# Patient Record
Sex: Female | Born: 1955 | Race: White | Hispanic: No | Marital: Married
Health system: Midwestern US, Community
[De-identification: ages and names within clinical notes are randomized; demographics above are authoritative.]

## PROBLEM LIST (undated history)

## (undated) DIAGNOSIS — I10 Essential (primary) hypertension: Secondary | ICD-10-CM

## (undated) DIAGNOSIS — E669 Obesity, unspecified: Secondary | ICD-10-CM

## (undated) DIAGNOSIS — M8008XA Age-related osteoporosis with current pathological fracture, vertebra(e), initial encounter for fracture: Secondary | ICD-10-CM

## (undated) DIAGNOSIS — I059 Rheumatic mitral valve disease, unspecified: Secondary | ICD-10-CM

## (undated) DIAGNOSIS — E785 Hyperlipidemia, unspecified: Secondary | ICD-10-CM

## (undated) HISTORY — DX: Obesity, unspecified: E66.9

## (undated) HISTORY — DX: Essential (primary) hypertension: I10

## (undated) HISTORY — PX: WRIST SURGERY: SHX841

## (undated) HISTORY — DX: Rheumatic mitral valve disease, unspecified: I05.9

## (undated) HISTORY — DX: Hyperlipidemia, unspecified: E78.5

## (undated) HISTORY — DX: Age-related osteoporosis with current pathological fracture, vertebra(e), initial encounter for fracture: M80.08XA

---

## 2004-05-22 ENCOUNTER — Encounter: Admission: RE | Admit: 2004-05-22 | Discharge: 2004-05-22 | Payer: Self-pay | Admitting: Family Medicine

## 2004-06-27 ENCOUNTER — Encounter (INDEPENDENT_AMBULATORY_CARE_PROVIDER_SITE_OTHER): Payer: Self-pay | Admitting: Specialist

## 2004-06-27 ENCOUNTER — Encounter: Admission: RE | Admit: 2004-06-27 | Discharge: 2004-06-27 | Payer: Self-pay | Admitting: Family Medicine

## 2004-07-05 ENCOUNTER — Ambulatory Visit (HOSPITAL_COMMUNITY): Admission: RE | Admit: 2004-07-05 | Discharge: 2004-07-05 | Payer: Self-pay | Admitting: Family Medicine

## 2004-08-29 ENCOUNTER — Ambulatory Visit: Payer: Self-pay | Admitting: Cardiology

## 2004-09-04 ENCOUNTER — Ambulatory Visit: Payer: Self-pay

## 2005-03-12 ENCOUNTER — Encounter: Admission: RE | Admit: 2005-03-12 | Discharge: 2005-03-12 | Payer: Self-pay | Admitting: Family Medicine

## 2005-03-18 ENCOUNTER — Encounter: Admission: RE | Admit: 2005-03-18 | Discharge: 2005-03-18 | Payer: Self-pay | Admitting: Family Medicine

## 2005-11-28 ENCOUNTER — Emergency Department (HOSPITAL_COMMUNITY): Admission: EM | Admit: 2005-11-28 | Discharge: 2005-11-28 | Payer: Self-pay | Admitting: Family Medicine

## 2006-05-05 ENCOUNTER — Encounter: Admission: RE | Admit: 2006-05-05 | Discharge: 2006-05-05 | Payer: Self-pay | Admitting: Family Medicine

## 2008-05-22 ENCOUNTER — Encounter: Admission: RE | Admit: 2008-05-22 | Discharge: 2008-05-22 | Payer: Self-pay | Admitting: Physician Assistant

## 2009-04-11 ENCOUNTER — Emergency Department (HOSPITAL_COMMUNITY): Admission: EM | Admit: 2009-04-11 | Discharge: 2009-04-11 | Payer: Self-pay | Admitting: Family Medicine

## 2009-07-02 ENCOUNTER — Encounter: Admission: RE | Admit: 2009-07-02 | Discharge: 2009-07-02 | Payer: Self-pay | Admitting: Family Medicine

## 2010-07-20 ENCOUNTER — Emergency Department (HOSPITAL_COMMUNITY): Admission: EM | Admit: 2010-07-20 | Discharge: 2010-07-21 | Payer: Self-pay | Admitting: Emergency Medicine

## 2010-09-15 ENCOUNTER — Encounter: Payer: Self-pay | Admitting: Family Medicine

## 2010-11-05 LAB — CBC
HCT: 37.1 % (ref 36.0–46.0)
Hemoglobin: 12.9 g/dL (ref 12.0–15.0)
MCH: 29.7 pg (ref 26.0–34.0)
MCHC: 34.8 g/dL (ref 30.0–36.0)
MCV: 85.5 fL (ref 78.0–100.0)
Platelets: 237 10*3/uL (ref 150–400)
RBC: 4.34 MIL/uL (ref 3.87–5.11)
RDW: 13.5 % (ref 11.5–15.5)
WBC: 7.4 10*3/uL (ref 4.0–10.5)

## 2010-11-05 LAB — DIFFERENTIAL
Basophils Absolute: 0 10*3/uL (ref 0.0–0.1)
Basophils Relative: 1 % (ref 0–1)
Eosinophils Absolute: 0.1 10*3/uL (ref 0.0–0.7)
Eosinophils Relative: 1 % (ref 0–5)
Lymphocytes Relative: 11 % — ABNORMAL LOW (ref 12–46)
Lymphs Abs: 0.8 10*3/uL (ref 0.7–4.0)
Monocytes Absolute: 0.5 10*3/uL (ref 0.1–1.0)
Monocytes Relative: 7 % (ref 3–12)
Neutro Abs: 6 10*3/uL (ref 1.7–7.7)
Neutrophils Relative %: 81 % — ABNORMAL HIGH (ref 43–77)

## 2010-11-05 LAB — COMPREHENSIVE METABOLIC PANEL
ALT: 27 U/L (ref 0–35)
AST: 29 U/L (ref 0–37)
Albumin: 3.7 g/dL (ref 3.5–5.2)
Alkaline Phosphatase: 52 U/L (ref 39–117)
BUN: 13 mg/dL (ref 6–23)
CO2: 27 mEq/L (ref 19–32)
Calcium: 8.7 mg/dL (ref 8.4–10.5)
Chloride: 101 mEq/L (ref 96–112)
Creatinine, Ser: 0.72 mg/dL (ref 0.4–1.2)
GFR calc Af Amer: >60 mL/min (ref >60)
GFR calc non Af Amer: >60 mL/min (ref >60)
Glucose, Bld: 130 mg/dL — ABNORMAL HIGH (ref 70–99)
Potassium: 3.5 mEq/L (ref 3.5–5.1)
Sodium: 136 mEq/L (ref 135–145)
Total Bilirubin: 0.5 mg/dL (ref 0.3–1.2)
Total Protein: 7.1 g/dL (ref 6.0–8.3)

## 2010-11-05 LAB — URINALYSIS, ROUTINE W REFLEX MICROSCOPIC
Bilirubin Urine: NEGATIVE
Glucose, UA: NEGATIVE mg/dL
Ketones, ur: NEGATIVE mg/dL
Leukocytes, UA: NEGATIVE
Nitrite: NEGATIVE
Protein, ur: NEGATIVE mg/dL
Specific Gravity, Urine: 1.021 (ref 1.005–1.030)
Urobilinogen, UA: 1 mg/dL (ref 0.0–1.0)
pH: 6 (ref 5.0–8.0)

## 2010-11-05 LAB — URINE MICROSCOPIC-ADD ON

## 2010-11-05 LAB — LIPASE, BLOOD: Lipase: 32 U/L (ref 11–59)

## 2010-11-30 LAB — POCT I-STAT, CHEM 8
BUN: 14 mg/dL (ref 6–23)
Calcium, Ion: 1.11 mmol/L — ABNORMAL LOW (ref 1.12–1.32)
Chloride: 103 mEq/L (ref 96–112)
Creatinine, Ser: 0.8 mg/dL (ref 0.4–1.2)
Glucose, Bld: 94 mg/dL (ref 70–99)
HCT: 39 % (ref 36.0–46.0)
Hemoglobin: 13.3 g/dL (ref 12.0–15.0)
Potassium: 3.2 mEq/L — ABNORMAL LOW (ref 3.5–5.1)
Sodium: 138 mEq/L (ref 135–145)
TCO2: 27 mmol/L (ref 0–100)

## 2011-08-28 ENCOUNTER — Other Ambulatory Visit: Payer: Self-pay | Admitting: Family Medicine

## 2011-08-28 DIAGNOSIS — Z1231 Encounter for screening mammogram for malignant neoplasm of breast: Secondary | ICD-10-CM

## 2011-09-04 ENCOUNTER — Ambulatory Visit
Admission: RE | Admit: 2011-09-04 | Discharge: 2011-09-04 | Disposition: A | Discharge status: Home / Self Care | Payer: 59 | Source: Ambulatory Visit | Attending: Family Medicine | Admitting: Family Medicine

## 2011-09-04 DIAGNOSIS — Z1231 Encounter for screening mammogram for malignant neoplasm of breast: Secondary | ICD-10-CM

## 2011-09-09 ENCOUNTER — Other Ambulatory Visit: Payer: Self-pay | Admitting: Family Medicine

## 2011-09-09 DIAGNOSIS — R928 Other abnormal and inconclusive findings on diagnostic imaging of breast: Secondary | ICD-10-CM

## 2011-09-17 ENCOUNTER — Ambulatory Visit
Admission: RE | Admit: 2011-09-17 | Discharge: 2011-09-17 | Disposition: A | Discharge status: Home / Self Care | Payer: 59 | Source: Ambulatory Visit | Attending: Family Medicine | Admitting: Family Medicine

## 2011-09-17 DIAGNOSIS — R928 Other abnormal and inconclusive findings on diagnostic imaging of breast: Secondary | ICD-10-CM

## 2011-09-25 LAB — HM MAMMOGRAPHY

## 2012-10-20 ENCOUNTER — Other Ambulatory Visit: Payer: Self-pay | Admitting: Internal Medicine

## 2012-10-20 DIAGNOSIS — IMO0002 Reserved for concepts with insufficient information to code with codable children: Secondary | ICD-10-CM

## 2012-10-20 DIAGNOSIS — M810 Age-related osteoporosis without current pathological fracture: Secondary | ICD-10-CM

## 2012-11-08 ENCOUNTER — Ambulatory Visit
Admission: RE | Admit: 2012-11-08 | Discharge: 2012-11-08 | Disposition: A | Discharge status: Home / Self Care | Payer: 59 | Source: Ambulatory Visit | Attending: Internal Medicine | Admitting: Internal Medicine

## 2012-11-08 DIAGNOSIS — IMO0002 Reserved for concepts with insufficient information to code with codable children: Secondary | ICD-10-CM

## 2012-11-08 DIAGNOSIS — M810 Age-related osteoporosis without current pathological fracture: Secondary | ICD-10-CM

## 2012-11-25 ENCOUNTER — Encounter: Payer: Self-pay | Admitting: Family Medicine

## 2012-11-25 DIAGNOSIS — I059 Rheumatic mitral valve disease, unspecified: Secondary | ICD-10-CM | POA: Insufficient documentation

## 2012-11-25 DIAGNOSIS — I10 Essential (primary) hypertension: Secondary | ICD-10-CM | POA: Insufficient documentation

## 2012-11-25 DIAGNOSIS — E669 Obesity, unspecified: Secondary | ICD-10-CM | POA: Insufficient documentation

## 2012-11-25 DIAGNOSIS — E785 Hyperlipidemia, unspecified: Secondary | ICD-10-CM | POA: Insufficient documentation

## 2012-11-26 ENCOUNTER — Encounter: Payer: Self-pay | Admitting: Family Medicine

## 2012-11-26 ENCOUNTER — Ambulatory Visit (INDEPENDENT_AMBULATORY_CARE_PROVIDER_SITE_OTHER): Payer: 59 | Admitting: Family Medicine

## 2012-11-26 VITALS — BP 126/74 | HR 60 | Temp 98.1°F | Resp 16

## 2012-11-26 DIAGNOSIS — I10 Essential (primary) hypertension: Secondary | ICD-10-CM

## 2012-11-26 DIAGNOSIS — M8008XD Age-related osteoporosis with current pathological fracture, vertebra(e), subsequent encounter for fracture with routine healing: Secondary | ICD-10-CM

## 2012-11-26 DIAGNOSIS — M8448XD Pathological fracture, other site, subsequent encounter for fracture with routine healing: Secondary | ICD-10-CM

## 2012-11-26 DIAGNOSIS — M8008XA Age-related osteoporosis with current pathological fracture, vertebra(e), initial encounter for fracture: Secondary | ICD-10-CM | POA: Insufficient documentation

## 2012-11-26 LAB — LIPID PANEL
Cholesterol: 145 mg/dL (ref 0–200)
LDL Cholesterol: 85 mg/dL (ref 0–99)
Triglycerides: 120 mg/dL (ref <150)

## 2012-11-26 LAB — BASIC METABOLIC PANEL
BUN: 13 mg/dL (ref 6–23)
Chloride: 106 mEq/L (ref 96–112)
Creat: 0.7 mg/dL (ref 0.50–1.10)

## 2012-11-26 LAB — HEPATIC FUNCTION PANEL
Alkaline Phosphatase: 58 U/L (ref 39–117)
Indirect Bilirubin: 0.3 mg/dL (ref 0.0–0.9)
Total Protein: 6.5 g/dL (ref 6.0–8.3)

## 2012-11-26 MED ORDER — BENAZEPRIL-HYDROCHLOROTHIAZIDE 20-12.5 MG PO TABS: 1.0000 | ORAL_TABLET | Freq: Every day | ORAL | Status: DC

## 2012-11-26 NOTE — Progress Notes (Signed)
Subjective:     Patient ID: Renea Ee, female   DOB: [birth year], 57 y.o.   MRN: [id number]  HPI  Patient is here for followup of her hypertension. Currently she is on Norvasc 5 mg by mouth daily and benazepril 20 mg by mouth daily.  She denies chest pain shortness of breath or dyspnea on exertion. She does complain of swelling in her legs. She also complains increased water retention to her body ever since we stopped the diuretic.  She's also requesting recheck her fasting lipid panel today.  Of note since last time I saw her she fell while trying to lift a couch. She sat down hard on the floor. She sustained a L1 vertebral fracture. She is currently being managed by Dr. Shon Baton. She was found to have a vitamin D level that was low at 26. I recommended she start vitamin D supplementation. Dr. 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 She requested my opinion on that.  Her bone density test revealed a T score of -1.6 the left femur but otherwise relatively normal. Past Medical History  Diagnosis Date  . Hypertension   . Obesity   . Mitral valve disorder   . Hyperlipidemia   . Vertebral fracture, osteoporotic    Med list is reviewed.  She has no known drug allergies  Review of Systems    review of systems is otherwise negative Objective:   Physical Exam  Constitutional: She appears well-developed and well-nourished.  HENT:  Mouth/Throat: Oropharynx is clear and moist.  Eyes: Conjunctivae are normal. Pupils are equal, round, and reactive to light.  Neck: Normal range of motion. Neck supple. No thyromegaly present.  Cardiovascular: Normal rate, regular rhythm, normal heart sounds and intact distal pulses.  Exam reveals no gallop and no friction rub.   No murmur heard. Pulmonary/Chest: Effort normal and breath sounds normal. No respiratory distress. She has no wheezes. She has no rales.  Abdominal: Soft. Bowel sounds are normal.   she has  trace bipedal edema with numerous varicosities in both legs     Assessment:     Hypertension Cholesterol screen Vertebral fracture Low vitamin D    Plan:     1. Essential hypertension, benign We will screen the patient's cholesterol today. Her goal LDL is less than 130.  Discontinue Norvasc and benazepril. Begin benazepril/HCTZ 20/12.5 one by mouth daily. 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 Therefore I recommended she start the Boniva recommended by Dr. Shon Baton. If Sandrea Hammond is too expensive I did discuss with her possibly using Fosamax because it is generic.

## 2012-11-29 ENCOUNTER — Telehealth: Payer: Self-pay | Admitting: Family Medicine

## 2012-11-29 NOTE — Telephone Encounter (Signed)
Yes, lets see if hctz with just 1 benazepril will control her bp and help with leg swelling.

## 2012-11-29 NOTE — Telephone Encounter (Signed)
Pt states that you changed her BP med to Benazepril and HCTZ to 20/12.5 1 po qd and took her off the Amlodipine. However she was taking Benazepril 20mg  2 (two) QD. Do you want her to just take one?

## 2012-11-29 NOTE — Telephone Encounter (Signed)
Pt aware.

## 2013-05-05 ENCOUNTER — Encounter: Payer: Self-pay | Admitting: Family Medicine

## 2013-05-05 ENCOUNTER — Ambulatory Visit (INDEPENDENT_AMBULATORY_CARE_PROVIDER_SITE_OTHER): Payer: 59 | Admitting: Family Medicine

## 2013-05-05 VITALS — BP 132/74 | HR 64 | Temp 98.1°F | Resp 18 | Wt 212.0 lb

## 2013-05-05 DIAGNOSIS — B372 Candidiasis of skin and nail: Secondary | ICD-10-CM

## 2013-05-05 MED ORDER — CLOTRIMAZOLE-BETAMETHASONE 1-0.05 % EX CREA: TOPICAL_CREAM | Freq: Two times a day (BID) | CUTANEOUS | Status: DC

## 2013-05-05 MED ORDER — FLUCONAZOLE 150 MG PO TABS: ORAL_TABLET | ORAL | Status: DC

## 2013-05-05 NOTE — Progress Notes (Signed)
  Subjective:    Patient ID: Gabrielle Padilla, female    DOB: 1956/06/12, 57 y.o.   MRN: [id number]  HPI Patient reports a two-week history of a painful erythematous rash underneath her stomach panus and in the folds of her growing between her thighs and her perineum.  The rash is bright red with satellite erythematous papules surrounding it. It has the characteristic appearance of candida intertrigo. Past Medical History  Diagnosis Date  . Hypertension   . Obesity   . Mitral valve disorder   . Hyperlipidemia   . Vertebral fracture, osteoporotic    Current Outpatient Prescriptions on File Prior to Visit  Medication Sig Dispense Refill  . benazepril-hydrochlorthiazide (LOTENSIN HCT) 20-12.5 MG per tablet Take 1 tablet by mouth daily.  90 tablet  3  . fish oil-omega-3 fatty acids 1000 MG capsule Take 1,000 mg by mouth daily.       No current facility-administered medications on file prior to visit.   No Known Allergies History   Social History  . Marital Status: Married    Spouse Name: N/A    Number of Children: N/A  . Years of Education: N/A   Occupational History  . Not on file.   Social History Main Topics  . Smoking status: Never Smoker   . Smokeless tobacco: Not on file  . Alcohol Use: No  . Drug Use: No  . Sexual Activity: Not on file   Other Topics Concern  . Not on file   Social History Narrative  . No narrative on file      Review of Systems  All other systems reviewed and are negative.       Objective:   Physical Exam  Vitals reviewed. Cardiovascular: Normal rate and regular rhythm.   Pulmonary/Chest: Effort normal and breath sounds normal.  Skin: Skin is warm. Rash noted. There is erythema.  Severe Candida intertrigo underneath her stomach pannus and in the skin folds between her thigh and perineum.        Assessment & Plan:  1. Candidal intertrigo The rash is severe. Begin Diflucan 150 mg by mouth QOD for 2 weeks and apply Lotrisone twice a  day for 14 days.  Recheck in 2 weeks if no better. - fluconazole (DIFLUCAN) 150 MG tablet; Take 1 pill every other day for 2 weeks.  Dispense: 7 tablet; Refill: 0 - clotrimazole-betamethasone (LOTRISONE) cream; Apply topically 2 (two) times daily. Apply twice a day for 14 days  Dispense: 45 g; Refill: 0

## 2013-08-30 ENCOUNTER — Other Ambulatory Visit: Payer: Self-pay

## 2013-08-30 DIAGNOSIS — Z1231 Encounter for screening mammogram for malignant neoplasm of breast: Secondary | ICD-10-CM

## 2013-09-14 ENCOUNTER — Ambulatory Visit
Admission: RE | Admit: 2013-09-14 | Discharge: 2013-09-14 | Disposition: A | Discharge status: Home / Self Care | Payer: 59 | Source: Ambulatory Visit

## 2013-09-14 DIAGNOSIS — Z1231 Encounter for screening mammogram for malignant neoplasm of breast: Secondary | ICD-10-CM

## 2013-11-04 ENCOUNTER — Other Ambulatory Visit: Payer: Self-pay | Admitting: Family Medicine

## 2013-11-04 NOTE — Telephone Encounter (Signed)
Medication filled x1 with no refills.   Requires office visit before any further refills can be given.  

## 2014-03-17 ENCOUNTER — Encounter: Payer: Self-pay | Admitting: Family Medicine

## 2014-03-17 ENCOUNTER — Ambulatory Visit (INDEPENDENT_AMBULATORY_CARE_PROVIDER_SITE_OTHER): Payer: 59 | Admitting: Family Medicine

## 2014-03-17 VITALS — BP 128/70 | HR 68 | Temp 97.9°F | Resp 14 | Ht 64.0 in | Wt 189.0 lb

## 2014-03-17 DIAGNOSIS — I10 Essential (primary) hypertension: Secondary | ICD-10-CM

## 2014-03-17 LAB — COMPLETE METABOLIC PANEL WITH GFR
ALBUMIN: 4 g/dL (ref 3.5–5.2)
ALT: 17 U/L (ref 0–35)
AST: 16 U/L (ref 0–37)
Alkaline Phosphatase: 45 U/L (ref 39–117)
BUN: 12 mg/dL (ref 6–23)
CO2: 27 meq/L (ref 19–32)
Calcium: 8.9 mg/dL (ref 8.4–10.5)
Chloride: 104 mEq/L (ref 96–112)
Creat: 0.7 mg/dL (ref 0.50–1.10)
GLUCOSE: 86 mg/dL (ref 70–99)
Potassium: 3.6 mEq/L (ref 3.5–5.3)
SODIUM: 141 meq/L (ref 135–145)
TOTAL PROTEIN: 6.4 g/dL (ref 6.0–8.3)
Total Bilirubin: 0.6 mg/dL (ref 0.2–1.2)

## 2014-03-17 LAB — LIPID PANEL
CHOLESTEROL: 119 mg/dL (ref 0–200)
HDL: 32 mg/dL — ABNORMAL LOW (ref >39)
LDL Cholesterol: 68 mg/dL (ref 0–99)
Total CHOL/HDL Ratio: 3.7 Ratio
Triglycerides: 95 mg/dL (ref <150)
VLDL: 19 mg/dL (ref 0–40)

## 2014-03-17 MED ORDER — BENAZEPRIL-HYDROCHLOROTHIAZIDE 20-12.5 MG PO TABS: ORAL_TABLET | ORAL | Status: DC

## 2014-03-17 NOTE — Progress Notes (Signed)
   Subjective:    Patient ID: Gabrielle Padilla, female    DOB: 1956-03-26, 58 y.o.   MRN: [id number]  HPI Patient is here for followup of her hypertension. Her blood pressures currently well controlled at 120/70. She continues to take benazepril hydrochlorothiazide 20/12.5 mg one by mouth daily. She denies any chest pain shortness of breath or dyspnea on exertion. She is overdue for pap smear. Past Medical History  Diagnosis Date  . Hypertension   . Obesity   . Mitral valve disorder   . Hyperlipidemia   . Vertebral fracture, osteoporotic    Past Surgical History  Procedure Laterality Date  . Cesarean section      x 3  . Wrist surgery     Current outpatient prescriptions:benazepril-hydrochlorthiazide (LOTENSIN HCT) 20-12.5 MG per tablet, TAKE 1 TABLET BY MOUTH DAILY., Disp: 90 tablet, Rfl: 3 No Known Allergies History   Social History  . Marital Status: Married    Spouse Name: N/A    Number of Children: N/A  . Years of Education: N/A   Occupational History  . Not on file.   Social History Main Topics  . Smoking status: Never Smoker   . Smokeless tobacco: Not on file  . Alcohol Use: No  . Drug Use: No  . Sexual Activity: Not on file   Other Topics Concern  . Not on file   Social History Narrative  . No narrative on file      Review of Systems  All other systems reviewed and are negative.      Objective:   Physical Exam  Vitals reviewed. Cardiovascular: Normal rate, regular rhythm and normal heart sounds.   No murmur heard. Pulmonary/Chest: Effort normal and breath sounds normal. No respiratory distress. She has no wheezes. She has no rales.  Abdominal: Soft. Bowel sounds are normal. She exhibits no distension. There is no tenderness. There is no rebound and no guarding.  Musculoskeletal: She exhibits no edema.          Assessment & Plan:  1. Essential hypertension Blood pressure is well controlled. I will make no changes in her medication at this time.  I will check a fasting lipid panel. Also recommend she schedule complete physical exam is limited due to Pap smear at her earliest convenience - COMPLETE METABOLIC PANEL WITH GFR - Lipid panel

## 2015-02-16 ENCOUNTER — Other Ambulatory Visit: Payer: Self-pay

## 2015-02-16 DIAGNOSIS — Z1231 Encounter for screening mammogram for malignant neoplasm of breast: Secondary | ICD-10-CM

## 2015-03-05 ENCOUNTER — Ambulatory Visit
Admission: RE | Admit: 2015-03-05 | Discharge: 2015-03-05 | Disposition: A | Discharge status: Home / Self Care | Payer: 59 | Source: Ambulatory Visit

## 2015-03-05 DIAGNOSIS — Z1231 Encounter for screening mammogram for malignant neoplasm of breast: Secondary | ICD-10-CM

## 2015-03-16 ENCOUNTER — Other Ambulatory Visit: Payer: Self-pay | Admitting: Family Medicine

## 2015-03-16 NOTE — Telephone Encounter (Signed)
Medication refilled per protocol. 

## 2015-10-02 ENCOUNTER — Ambulatory Visit (INDEPENDENT_AMBULATORY_CARE_PROVIDER_SITE_OTHER): Payer: 59 | Admitting: Family Medicine

## 2015-10-02 ENCOUNTER — Encounter: Payer: Self-pay | Admitting: Family Medicine

## 2015-10-02 VITALS — BP 126/84 | HR 72 | Temp 98.3°F | Resp 18 | Wt 211.0 lb

## 2015-10-02 DIAGNOSIS — J029 Acute pharyngitis, unspecified: Secondary | ICD-10-CM

## 2015-10-02 DIAGNOSIS — J208 Acute bronchitis due to other specified organisms: Secondary | ICD-10-CM | POA: Diagnosis not present

## 2015-10-02 LAB — STREP GROUP A AG, W/REFLEX TO CULT: STREGTOCOCCUS GROUP A AG SCREEN: NOT DETECTED

## 2015-10-02 MED ORDER — HYDROCODONE-HOMATROPINE 5-1.5 MG/5ML PO SYRP: 5.0000 mL | ORAL_SOLUTION | Freq: Three times a day (TID) | ORAL | Status: AC | PRN

## 2015-10-02 MED ORDER — AZITHROMYCIN 250 MG PO TABS: ORAL_TABLET | ORAL | Status: AC

## 2015-10-02 NOTE — Progress Notes (Signed)
   Subjective:    Patient ID: Gabrielle Padilla, female    DOB: April 05, 1956, 61 y.o.   MRN: [id number]  HPI Should symptoms began 5 days ago. She reports a nonproductive cough, a sore scratchy throat, subjective fevers and malaise. She denies any shortness of breath. She denies any purulent sputum. She denies any chest pain. Past Medical History  Diagnosis Date  . Hypertension   . Obesity   . Mitral valve disorder   . Hyperlipidemia   . Vertebral fracture, osteoporotic [facility])    Past Surgical History  Procedure Laterality Date  . Cesarean section      x 3  . Wrist surgery     Current Outpatient Prescriptions on File Prior to Visit  Medication Sig Dispense Refill  . benazepril-hydrochlorthiazide (LOTENSIN HCT) 20-12.5 MG per tablet TAKE 1 TABLET BY MOUTH DAILY. 90 tablet 3   No current facility-administered medications on file prior to visit.   No Known Allergies Social History   Social History  . Marital Status: Married    Spouse Name: N/A  . Number of Children: N/A  . Years of Education: N/A   Occupational History  . Not on file.   Social History Main Topics  . Smoking status: Never Smoker   . Smokeless tobacco: Not on file  . Alcohol Use: No  . Drug Use: No  . Sexual Activity: Not on file   Other Topics Concern  . Not on file   Social History Narrative      Review of Systems  All other systems reviewed and are negative.      Objective:   Physical Exam  Constitutional: She appears well-developed and well-nourished.  HENT:  Right Ear: External ear normal.  Left Ear: External ear normal.  Nose: Nose normal.  Mouth/Throat: Oropharynx is clear and moist. No oropharyngeal exudate.  Neck: Neck supple.  Cardiovascular: Normal rate, regular rhythm and normal heart sounds.   Pulmonary/Chest: Effort normal and breath sounds normal. No respiratory distress. She has no wheezes. She has no rales.  Lymphadenopathy:    She has no cervical adenopathy.  Vitals  reviewed.         Assessment & Plan:  Sorethroat - Plan: STREP GROUP A AG, W/REFLEX TO CULT  Acute bronchitis due to other specified organisms - Plan: azithromycin (ZITHROMAX) 250 MG tablet, HYDROcodone-homatropine (HYCODAN) 5-1.5 MG/5ML syrup  A she appears to have viral bronchitis. I recommended tincture of time. She can use Hycodan 1 teaspoon every 8 hours as needed for coughing. Should she develop high fever, purulent sputum, and a worsening cough I did give her a Z-Pak to take with her. She knows not to get the antibiotic filled unless symptoms worsen dramatically. However she is traveling out of town to go visit her father has been placed on hospice and I wanted her to have access to this while she is traveling just in case.

## 2015-10-11 ENCOUNTER — Telehealth: Payer: Self-pay | Admitting: Family Medicine

## 2015-10-11 DIAGNOSIS — R05 Cough: Secondary | ICD-10-CM

## 2015-10-11 DIAGNOSIS — R059 Cough, unspecified: Secondary | ICD-10-CM

## 2015-10-11 MED ORDER — BENZONATATE 200 MG PO CAPS: 200.0000 mg | ORAL_CAPSULE | Freq: Three times a day (TID) | ORAL | Status: AC

## 2015-10-11 NOTE — Telephone Encounter (Signed)
Patient was in a week ago, and her cough is no better after zpak  Would like to know what she should do  [phone]

## 2015-10-11 NOTE — Telephone Encounter (Signed)
LMOVM with instructions for cxr and medication. If she has any questions to call back.

## 2015-10-11 NOTE — Telephone Encounter (Signed)
Proceed with cxr, meanwhile tessalon perles 200 mg poq8 hrs prn cough.

## 2016-03-10 ENCOUNTER — Other Ambulatory Visit: Payer: Self-pay | Admitting: Family Medicine

## 2016-03-10 NOTE — Telephone Encounter (Signed)
Refill appropriate and filled per protocol. 

## 2016-06-07 ENCOUNTER — Other Ambulatory Visit: Payer: Self-pay | Admitting: Family Medicine

## 2016-07-11 ENCOUNTER — Other Ambulatory Visit: Payer: Self-pay | Admitting: Family Medicine

## 2016-07-11 MED ORDER — BENAZEPRIL-HYDROCHLOROTHIAZIDE 20-12.5 MG PO TABS: 1.0000 | ORAL_TABLET | Freq: Every day | ORAL | 0 refills | Status: DC

## 2016-08-01 DIAGNOSIS — H73892 Other specified disorders of tympanic membrane, left ear: Secondary | ICD-10-CM | POA: Insufficient documentation

## 2016-08-01 DIAGNOSIS — H906 Mixed conductive and sensorineural hearing loss, bilateral: Secondary | ICD-10-CM | POA: Insufficient documentation

## 2016-09-17 ENCOUNTER — Other Ambulatory Visit: Payer: Self-pay | Admitting: Family Medicine

## 2016-09-17 NOTE — Telephone Encounter (Signed)
30 day refill BP med only.  Pt has not been seen or had any lab work done since 2105, except for a brief sick visit.  Must be seen to continue to refill meds.  Letter to patient to make appt.

## 2017-01-01 DIAGNOSIS — I10 Essential (primary) hypertension: Secondary | ICD-10-CM | POA: Insufficient documentation

## 2017-01-26 DIAGNOSIS — R7989 Other specified abnormal findings of blood chemistry: Secondary | ICD-10-CM | POA: Insufficient documentation

## 2020-06-14 DIAGNOSIS — R9431 Abnormal electrocardiogram [ECG] [EKG]: Secondary | ICD-10-CM | POA: Insufficient documentation

## 2020-06-14 DIAGNOSIS — I444 Left anterior fascicular block: Secondary | ICD-10-CM | POA: Insufficient documentation

## 2021-05-04 ENCOUNTER — Emergency Department: Admit: 2021-05-04 | Payer: Auto Insurance (includes no fault)

## 2021-05-04 ENCOUNTER — Inpatient Hospital Stay
Admit: 2021-05-04 | Discharge: 2021-05-04 | Disposition: A | Discharge status: Home / Self Care | Payer: Auto Insurance (includes no fault) | Attending: Emergency Medicine

## 2021-05-04 DIAGNOSIS — S2220XA Unspecified fracture of sternum, initial encounter for closed fracture: Secondary | ICD-10-CM

## 2021-05-04 LAB — CBC WITH AUTOMATED DIFF
ABS. BASOPHILS: 0.1 10*3/uL (ref 0.0–0.1)
ABS. EOSINOPHILS: 0.2 10*3/uL (ref 0.0–0.4)
ABS. IMM. GRANS.: 0 10*3/uL (ref 0.00–0.04)
ABS. LYMPHOCYTES: 1.5 10*3/uL (ref 0.8–3.5)
ABS. MONOCYTES: 0.6 10*3/uL (ref 0.0–1.0)
ABS. NEUTROPHILS: 4.3 10*3/uL (ref 1.8–8.0)
ABSOLUTE NRBC: 0 10*3/uL (ref 0.00–0.01)
BASOPHILS: 1 % (ref 0–1)
EOSINOPHILS: 3 % (ref 0–7)
HCT: 39.2 % (ref 35.0–47.0)
HGB: 13.5 g/dL (ref 11.5–16.0)
IMMATURE GRANULOCYTES: 1 % — ABNORMAL HIGH (ref 0–0.5)
LYMPHOCYTES: 22 % (ref 12–49)
MCH: 29.7 PG (ref 26.0–34.0)
MCHC: 34.4 g/dL (ref 30.0–36.5)
MCV: 86.3 FL (ref 80.0–99.0)
MONOCYTES: 9 % (ref 5–13)
MPV: 9.8 FL (ref 8.9–12.9)
NEUTROPHILS: 64 % (ref 32–75)
NRBC: 0 PER 100 WBC
PLATELET: 275 10*3/uL (ref 150–400)
RBC: 4.54 M/uL (ref 3.80–5.20)
RDW: 13.4 % (ref 11.5–14.5)
WBC: 6.7 10*3/uL (ref 3.6–11.0)

## 2021-05-04 LAB — METABOLIC PANEL, COMPREHENSIVE
A-G Ratio: 1.1 (ref 1.1–2.2)
ALT (SGPT): 33 U/L (ref 12–78)
AST (SGOT): 23 U/L (ref 15–37)
Albumin: 3.9 g/dL (ref 3.5–5.0)
Alk. phosphatase: 55 U/L (ref 45–117)
Anion gap: 5 mmol/L (ref 5–15)
BUN/Creatinine ratio: 15 (ref 12–20)
BUN: 12 mg/dL (ref 6–20)
Bilirubin, total: 0.6 mg/dL (ref 0.2–1.0)
CO2: 29 mmol/L (ref 21–32)
Calcium: 8.8 mg/dL (ref 8.5–10.1)
Chloride: 103 mmol/L (ref 97–108)
Creatinine: 0.8 mg/dL (ref 0.55–1.02)
GFR est AA: >60 mL/min/{1.73_m2} (ref >60)
GFR est non-AA: >60 mL/min/{1.73_m2} (ref >60)
Globulin: 3.4 g/dL (ref 2.0–4.0)
Glucose: 113 mg/dL — ABNORMAL HIGH (ref 65–100)
Potassium: 3.1 mmol/L — ABNORMAL LOW (ref 3.5–5.1)
Protein, total: 7.3 g/dL (ref 6.4–8.2)
Sodium: 137 mmol/L (ref 136–145)

## 2021-05-04 LAB — CBC WITH AUTO DIFFERENTIAL
Basophils %: 1 % (ref 0–1)
Basophils Absolute: 0.1 10*3/uL (ref 0.0–0.1)
Eosinophils %: 3 % (ref 0–7)
Eosinophils Absolute: 0.2 10*3/uL (ref 0.0–0.4)
Granulocyte Absolute Count: 0 10*3/uL (ref 0.00–0.04)
Hematocrit: 39.2 % (ref 35.0–47.0)
Hemoglobin: 13.5 g/dL (ref 11.5–16.0)
Immature Granulocytes %: 1 % — ABNORMAL HIGH (ref 0–0.5)
Lymphocytes %: 22 % (ref 12–49)
Lymphocytes Absolute: 1.5 10*3/uL (ref 0.8–3.5)
MCH: 29.7 PG (ref 26.0–34.0)
MCHC: 34.4 g/dL (ref 30.0–36.5)
MCV: 86.3 FL (ref 80.0–99.0)
MPV: 9.8 FL (ref 8.9–12.9)
Monocytes %: 9 % (ref 5–13)
Monocytes Absolute: 0.6 10*3/uL (ref 0.0–1.0)
NRBC Absolute: 0 10*3/uL (ref 0.00–0.01)
Neutrophils %: 64 % (ref 32–75)
Neutrophils Absolute: 4.3 10*3/uL (ref 1.8–8.0)
Nucleated RBCs: 0 PER 100 WBC
Platelets: 275 10*3/uL (ref 150–400)
RBC: 4.54 M/uL (ref 3.80–5.20)
RDW: 13.4 % (ref 11.5–14.5)
WBC: 6.7 10*3/uL (ref 3.6–11.0)

## 2021-05-04 LAB — COMPREHENSIVE METABOLIC PANEL
ALT: 33 U/L (ref 12–78)
AST: 23 U/L (ref 15–37)
Albumin/Globulin Ratio: 1.1 (ref 1.1–2.2)
Albumin: 3.9 g/dL (ref 3.5–5.0)
Alkaline Phosphatase: 55 U/L (ref 45–117)
Anion Gap: 5 mmol/L (ref 5–15)
BUN/Creatinine Ratio: 15 (ref 12–20)
BUN: 12 mg/dL (ref 6–20)
CO2: 29 mmol/L (ref 21–32)
Calcium: 8.8 mg/dL (ref 8.5–10.1)
Chloride: 103 mmol/L (ref 97–108)
Creatinine: 0.8 mg/dL (ref 0.55–1.02)
GFR African American: >60 mL/min/{1.73_m2} (ref >60)
Globulin: 3.4 g/dL (ref 2.0–4.0)
Glucose: 113 mg/dL — ABNORMAL HIGH (ref 65–100)
Potassium: 3.1 mmol/L — ABNORMAL LOW (ref 3.5–5.1)
Sodium: 137 mmol/L (ref 136–145)
Total Bilirubin: 0.6 mg/dL (ref 0.2–1.0)
Total Protein: 7.3 g/dL (ref 6.4–8.2)
eGFR NON-AA: >60 mL/min/{1.73_m2} (ref >60)

## 2021-05-04 MED ORDER — HYDROCODONE-ACETAMINOPHEN 5 MG-325 MG TAB
5-325 mg | ORAL_TABLET | Freq: Four times a day (QID) | ORAL | 0 refills | Status: AC | PRN
Start: 2021-05-04 — End: 2021-05-07

## 2021-05-04 MED ORDER — IOPAMIDOL 76 % IV SOLN
76 % | Freq: Once | INTRAVENOUS | Status: AC
Start: 2021-05-04 — End: 2021-05-04
  Administered 2021-05-04: 19:00:00 via INTRAVENOUS

## 2021-05-04 MED FILL — ISOVUE-370  76 % INTRAVENOUS SOLUTION: 370 mg iodine /mL (76 %) | INTRAVENOUS | Qty: 100
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
## 2021-10-14 DIAGNOSIS — H7102 Cholesteatoma of attic, left ear: Secondary | ICD-10-CM | POA: Insufficient documentation

## 2021-10-24 DIAGNOSIS — H7192 Unspecified cholesteatoma, left ear: Secondary | ICD-10-CM | POA: Insufficient documentation

## 2021-10-25 ENCOUNTER — Other Ambulatory Visit: Payer: Self-pay | Admitting: Physician Assistant

## 2021-10-25 DIAGNOSIS — H7192 Unspecified cholesteatoma, left ear: Secondary | ICD-10-CM

## 2021-11-18 ENCOUNTER — Ambulatory Visit
Admission: RE | Admit: 2021-11-18 | Discharge: 2021-11-18 | Disposition: A | Discharge status: Home / Self Care | Payer: Medicare Other | Source: Ambulatory Visit | Attending: Physician Assistant | Admitting: Physician Assistant

## 2021-11-18 DIAGNOSIS — H7192 Unspecified cholesteatoma, left ear: Secondary | ICD-10-CM

## 2022-09-01 ENCOUNTER — Other Ambulatory Visit: Payer: Self-pay | Admitting: Family Medicine

## 2022-09-01 DIAGNOSIS — Z1231 Encounter for screening mammogram for malignant neoplasm of breast: Secondary | ICD-10-CM

## 2022-09-03 ENCOUNTER — Other Ambulatory Visit: Payer: Self-pay | Admitting: Family Medicine

## 2022-09-03 DIAGNOSIS — Z780 Asymptomatic menopausal state: Secondary | ICD-10-CM

## 2022-09-04 ENCOUNTER — Ambulatory Visit
Admission: RE | Admit: 2022-09-04 | Discharge: 2022-09-04 | Disposition: A | Discharge status: Home / Self Care | Payer: Medicare Other | Source: Ambulatory Visit | Attending: Family Medicine | Admitting: Family Medicine

## 2022-09-04 DIAGNOSIS — Z1231 Encounter for screening mammogram for malignant neoplasm of breast: Secondary | ICD-10-CM

## 2022-09-05 ENCOUNTER — Ambulatory Visit
Admission: RE | Admit: 2022-09-05 | Discharge: 2022-09-05 | Disposition: A | Discharge status: Home / Self Care | Payer: Medicare Other | Source: Ambulatory Visit | Attending: Family Medicine | Admitting: Family Medicine

## 2022-09-05 DIAGNOSIS — Z780 Asymptomatic menopausal state: Secondary | ICD-10-CM

## 2022-09-08 ENCOUNTER — Other Ambulatory Visit (HOSPITAL_BASED_OUTPATIENT_CLINIC_OR_DEPARTMENT_OTHER): Payer: Self-pay | Admitting: Family Medicine

## 2022-09-08 DIAGNOSIS — E041 Nontoxic single thyroid nodule: Secondary | ICD-10-CM

## 2022-09-16 ENCOUNTER — Ambulatory Visit (HOSPITAL_BASED_OUTPATIENT_CLINIC_OR_DEPARTMENT_OTHER)
Admission: RE | Admit: 2022-09-16 | Discharge: 2022-09-16 | Disposition: A | Discharge status: Home / Self Care | Payer: Medicare Other | Source: Ambulatory Visit | Attending: Family Medicine | Admitting: Family Medicine

## 2022-09-16 DIAGNOSIS — E041 Nontoxic single thyroid nodule: Secondary | ICD-10-CM | POA: Diagnosis not present

## 2023-12-14 DIAGNOSIS — I10 Essential (primary) hypertension: Secondary | ICD-10-CM | POA: Insufficient documentation

## 2023-12-14 DIAGNOSIS — K219 Gastro-esophageal reflux disease without esophagitis: Secondary | ICD-10-CM | POA: Insufficient documentation

## 2023-12-14 DIAGNOSIS — Z860100 Personal history of colon polyps, unspecified: Secondary | ICD-10-CM | POA: Insufficient documentation

## 2023-12-15 ENCOUNTER — Ambulatory Visit (INDEPENDENT_AMBULATORY_CARE_PROVIDER_SITE_OTHER): Payer: Medicare Other | Admitting: Neurology

## 2023-12-15 ENCOUNTER — Encounter: Payer: Self-pay | Admitting: Neurology

## 2023-12-15 VITALS — BP 119/67 | HR 53 | Ht 64.0 in | Wt 216.0 lb

## 2023-12-15 DIAGNOSIS — Z820 Family history of epilepsy and other diseases of the nervous system: Secondary | ICD-10-CM

## 2023-12-15 DIAGNOSIS — F439 Reaction to severe stress, unspecified: Secondary | ICD-10-CM

## 2023-12-15 DIAGNOSIS — G250 Essential tremor: Secondary | ICD-10-CM | POA: Diagnosis not present

## 2023-12-15 MED ORDER — PRIMIDONE 50 MG PO TABS: 50.0000 mg | ORAL_TABLET | Freq: Every day | ORAL | 5 refills | Status: DC

## 2023-12-15 NOTE — Patient Instructions (Signed)
 You have a hand tremor and lower jaw tremor, also a family history of tremors.  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 I do not see any signs or symptoms of parkinson's like disease or what we call parkinsonism.  For your tremor, I commend that you continue with your metoprolol at the current dose.  I would be cautious in increasing it because you already have a low normal blood pressure and a below 60 heart rate.  As discussed, we will add a low-dose of primidone  at this time. Mysoline  (primidone ) 50 mg strength: Take 1/2 pill each bedtime for 2 weeks, then 1 pill each bedtime thereafter. Common side effects reported are: Sleepiness, drowsiness, balance problems, confusion, and GI related symptoms.   In the future, we may be able to increase the primidone .  Please try to hydrate well with water, 64 ounces per day are recommended, generally speaking.  Be consistent with your PAP machine for sleep apnea as better sleep may help your tremor.  Please remember, that any kind of tremor may be exacerbated by anxiety, anger, nervousness, excitement, dehydration, sleep deprivation, thyroid  dysfunction, by caffeine, and low blood sugar values or blood sugar fluctuations. Some medications can exacerbate tremors, this includes certain asthma or COPD medications and certain antidepressants.

## 2023-12-15 NOTE — Progress Notes (Signed)
 Subjective:    Patient ID: Gabrielle Padilla is a 68 y.o. female.  HPI    Gabrielle Fairy, MD, PhD [facility] [address], [address] [address] [place], [place] [address]  Dear Dr. Avon Boers,   I saw your patient, Gabrielle Padilla, upon your kind request in my neurologic clinic today for initial consultation of her tremor.  The patient is unaccompanied today.  As you know, Ms. [name] is a 68 year old female with an underlying medical history of hypertension, hyperlipidemia, mitral valve disorder, anxiety, reflux disease, OSA, on PAP therapy, and obesity, who reports a longstanding history of tremors, particularly bilateral hand tremors.  She reports that she started having a hand tremor at least 6 years ago and within the past year she started noticing a lower jaw and lip tremor.  She does have a strong family history of tremors affecting her mom and maternal grandfather, sister and her brother.  Her father had Parkinson's disease.  She has been on medication for her tremors for years off and on.  She was on primidone  several years ago and stopped taking it.  She does not recall that she had side effects but it stopped working apparently.  She then tried propranolol but she had side effects including sedation and lethargy from it.  She has been on metoprolol for the past several months and tolerates it but still has residual tremors and mild worsening, she does endorse stress within the past year particularly with regards to her husband's health.  She has been on generic Lexapro for the past several years, currently 10 mg once daily.  I reviewed your office visit note from 09/04/2023, she saw Nella Bame, NP at the time.  She is a non-smoker.  She works part-time for [employer], she works from home.  She lives with her husband.  She drinks caffeine in the form of tea, usually 1 cup in the morning and sometimes 1 glass of unsweetened tea with lunch.  She does not drink alcohol on a regular  basis, she is a non-smoker.  She was recently diagnosed with obstructive sleep apnea and started using a CPAP machine about 2 weeks ago.  She is still adjusting to treatment.  She tries to hydrate well but admits that she may not drink more than 36 ounces of water per day on average.   She had blood work through your office on 07/31/2023 and I reviewed the results in her paper chart.  TSH was unremarkable, CMP benign, vitamin D in the low normal range.  Her Past Medical History Is Significant For: Past Medical History:  Diagnosis Date   Hyperlipidemia    Hypertension    Mitral valve disorder    Obesity    Vertebral fracture, osteoporotic (HCC)     Her Past Surgical History Is Significant For: Past Surgical History:  Procedure Laterality Date   CESAREAN SECTION     x 3   WRIST SURGERY      Her Family History Is Significant For: Family History  Problem Relation Age of Onset   Coronary artery disease Mother    Hypertension Mother    Coronary artery disease Father    Coronary artery disease Paternal Grandfather     Her Social History Is Significant For: Social History   Socioeconomic History   Marital status: Married    Spouse name: Not on file   Number of children: Not on file   Years of education: Not on file  Highest education level: Not on file  Occupational History   Not on file  Tobacco Use   Smoking status: Never   Smokeless tobacco: Not on file  Substance and Sexual Activity   Alcohol use: No   Drug use: No   Sexual activity: Not on file  Other Topics Concern   Not on file  Social History Narrative   Not on file   Social Drivers of Health   Financial Resource Strain: Not on file  Food Insecurity: Not on file  Transportation Needs: Not on file  Physical Activity: Not on file  Stress: Not on file  Social Connections: Not on file    Her Allergies Are:  No Known Allergies:   Her Current Medications Are:  Outpatient Encounter Medications as of  12/15/2023  Medication Sig   amLODipine (NORVASC) 5 MG tablet Take 5 mg by mouth daily.   cetirizine (KLS ALLER-TEC) 10 MG tablet Take 10 mg by mouth daily.   Cholecalciferol (VITAMIN D3) 50 MCG (2000 UT) TABS Take by mouth.   escitalopram (LEXAPRO) 10 MG tablet Take 10 mg by mouth at bedtime.   losartan-hydrochlorothiazide  (HYZAAR) 100-12.5 MG tablet Take 1 tablet by mouth daily.   metoprolol succinate (TOPROL-XL) 25 MG 24 hr tablet Take 25 mg by mouth every morning.   Multiple Vitamins-Minerals (WOMENS 50+ MULTI VITAMIN) TABS Take by mouth.   omeprazole (PRILOSEC) 40 MG capsule Take 40 mg by mouth daily.   rosuvastatin (CRESTOR) 20 MG tablet Take 20 mg by mouth daily.   azithromycin  (ZITHROMAX ) 250 MG tablet 2 tabs poqday1, 1 tab poqday 2-5 (Patient not taking: Reported on 12/15/2023)   benazepril -hydrochlorthiazide (LOTENSIN  HCT) 20-12.5 MG tablet TAKE 1 TABLET DAILY (Patient not taking: Reported on 12/15/2023)   benzonatate  (TESSALON ) 200 MG capsule Take 1 capsule (200 mg total) by mouth every 8 (eight) hours. (Patient not taking: Reported on 12/15/2023)   HYDROcodone -homatropine (HYCODAN) 5-1.5 MG/5ML syrup Take 5 mLs by mouth every 8 (eight) hours as needed for cough. (Patient not taking: Reported on 12/15/2023)   No facility-administered encounter medications on file as of 12/15/2023.  :   Review of Systems:  Out of a complete 14 point review of systems, all are reviewed and negative with the exception of these symptoms as listed below:    Review of Systems  Neurological:        Room 9 Pt is here Alone. Pt states that her had tremor started 6 years ago, and her chin tremor started 6 months ago. Pt has a family history of Tremors in her family. Pt states that her husband was sick last year an she is not sure if that triggered her facial tremor. Pt states that her tremors are worse in her left had than the right. Pt states that she has difficulty typing on her computer due to her tremor.  Pt states that she has to be careful when she is holding her cup because of her tremor.     Objective:  Neurological Exam  Physical Exam Physical Examination:   Vitals:   12/15/23 1512  BP: 119/67  Pulse: (!) 53    General Examination: The patient is a very pleasant 68 y.o. female in no acute distress. She appears well-developed and well-nourished and well groomed.   HEENT: Normocephalic, atraumatic, pupils are equal, round and reactive to light, extraocular tracking is good without limitation to gaze excursion or nystagmus noted. Hearing is grossly intact. Face is symmetric with normal facial animation. Speech is clear  with no dysarthria noted. There is no hypophonia.  No significant voice tremor, she has a mild lower jaw and lip tremor.  Neck is supple with full range of passive and active motion. There are no carotid bruits on auscultation. Oropharynx exam reveals: mild mouth dryness, full dentures in place, tongue protrudes centrally and palate elevates symmetrically.   Chest: Clear to auscultation without wheezing, rhonchi or crackles noted.  Heart: S1+S2+0, regular and normal without murmurs, rubs or gallops noted.   Abdomen: Soft, non-tender and non-distended.  Extremities: There is no pitting edema in the distal lower extremities bilaterally.   Skin: Warm and dry without trophic changes noted.   Musculoskeletal: exam reveals no obvious joint deformities.   Neurologically:  Mental status: The patient is awake, alert and oriented in all 4 spheres. Her immediate and remote memory, attention, language skills and fund of knowledge are appropriate. There is no evidence of aphasia, agnosia, apraxia or anomia. Speech is clear with normal prosody and enunciation. Thought process is linear. Mood is normal and affect is normal.  Cranial nerves II - XII are as described above under HEENT exam.  Motor exam: Normal bulk, strength and tone is noted. There is no resting tremor.  She has a  mild bilateral upper extremity postural and action tremor, no intention tremor.    On 12/15/2023: On Archimedes spiral drawing she has a mild tremor bilaterally.  Handwriting is legible, slightly tremulous, not micrographic.    Fine motor skills and coordination: Globally mild impairment in fine motor skills, no lateralization, no decrement in amplitude.   Cerebellar testing: No dysmetria or intention tremor. There is no truncal or gait ataxia.  Normal finger-to-nose, normal heel-to-shin bilaterally. Flexes are 1+ in the upper extremities and trace in the lower extremities, toes are downgoing bilaterally. Sensory exam: intact to light touch in the upper and lower extremities.  Gait, station and balance: She stands easily. No veering to one side is noted. No leaning to one side is noted. Posture is age-appropriate and stance is narrow based. Gait shows normal stride length and normal pace. No problems turning are noted.   Assessment and Plan:  In summary, SHERYL SAINTIL is a very pleasant 68 year old female with an underlying medical history of hypertension, hyperlipidemia, mitral valve disorder, anxiety, reflux disease, OSA, on PAP therapy, and obesity, who presents for evaluation of her tremor disorder of several years duration with history and examination as well as family history in keeping with essential tremor.  She has noticed progression over time including a more recent onset of lower jaw and lip tremor.  She is advised that there is no evidence of parkinsonism and she is reassured in that regard.  We talked about essential tremor, and treatment options as well as the nature of the condition.  She is encouraged to ensure better hydration with water, continue to limit her caffeine, and try to sleep consistently with her PAP machine as better sleep may also help improve her tremor over time or keep it at bay.  We talked about stress is a big trigger for tremor flareup.  She has been on a beta-blocker  before and currently on Toprol.  She had side effects with propranolol.  She had tried primidone  in the past and would be willing to retry it in combination with her current beta-blocker.  To that end, I suggested she start on 50 mg strength with half a pill at bedtime for couple of weeks and then increase it to 1  pill once daily in the evening thereafter.  She will keep us  posted if she has any side effects, we talked about common side effects and expectations and limitation of the medication.  She is advised that increasing her beta-blocker would not be recommended at this time due to low normal blood pressure and below 60 heart rate.   We will plan to follow-up in about 6 months, sooner if needed.  I answered all her questions today and she was in agreement with our plan.   Thank you very much for allowing me to participate in the care of this nice patient. If I can be of any further assistance to you please do not hesitate to call me at [phone].  Sincerely,   Gabrielle Fairy, MD, PhD

## 2024-06-12 ENCOUNTER — Other Ambulatory Visit: Payer: Self-pay | Admitting: Neurology

## 2024-06-20 ENCOUNTER — Ambulatory Visit: Admitting: Neurology

## 2024-06-20 ENCOUNTER — Encounter: Payer: Self-pay | Admitting: Neurology

## 2024-06-20 VITALS — BP 127/64 | HR 63 | Ht 64.0 in | Wt 217.4 lb

## 2024-06-20 DIAGNOSIS — G250 Essential tremor: Secondary | ICD-10-CM | POA: Diagnosis not present

## 2024-06-20 MED ORDER — PRIMIDONE 50 MG PO TABS
50.0000 mg | ORAL_TABLET | Freq: Every day | ORAL | 3 refills | Status: AC
Start: 2024-06-20 — End: ?

## 2024-06-20 NOTE — Progress Notes (Signed)
 Subjective:    Padilla ID: Gabrielle Padilla is a 68 y.o. female.  HPI    Interim history:   Gabrielle Padilla is a 68 year old female with an underlying medical history of hypertension, hyperlipidemia, mitral valve disorder, anxiety, reflux disease, OSA, on PAP therapy, and obesity, who presents for follow-up consultation of Gabrielle Padilla tremor.  Gabrielle Padilla is unaccompanied today.  I first met Gabrielle Padilla at Gabrielle request of Gabrielle Padilla primary care provider on 12/15/2023, at which time she reported a several year history of tremor.  History and examination were in keeping with essential tremor.  She was on a beta-blocker.  She had side effects with propranolol and was on Toprol at Gabrielle time.  She had tried primidone  in Gabrielle past and we mutually agreed to restart Gabrielle Padilla on a small dose of primidone .  Today, 06/20/2024: She reports that Gabrielle Padilla tremor is a little better.  She is able to tolerate Gabrielle primidone  at 50 mg at bedtime.  Retiring also probable helped in reducing Gabrielle Padilla tremor by reducing stress level.  She also feels that she is sleeping better now that she is on CPAP therapy.  She is trying to hydrate well but estimates that she drinks about 32 ounces of water per day.  She limits Gabrielle Padilla caffeine to usually 1 cup of tea in Gabrielle morning but does drink 1 or 2 glasses of unsweet tea for lunch typically.  While she has not fallen, she is cautious about Gabrielle Padilla gait and balance as she has fallen in Gabrielle past and sustained a compression fracture.  She does not use a walking aid.  Gabrielle Padilla's allergies, current medications, family history, past medical history, past social history, past surgical history and problem list were reviewed and updated as appropriate.   Previously:  12/15/2023: (She) reports a longstanding history of tremors, particularly bilateral hand tremors.  She reports that she started having a hand tremor at least 6 years ago and within Gabrielle past year she started noticing a lower jaw and lip tremor.  She does have a strong family  history of tremors affecting Gabrielle Padilla mom and maternal grandfather, sister and Gabrielle Padilla brother.  Gabrielle Padilla father had Parkinson's disease.  She has been on medication for Gabrielle Padilla tremors for years off and on.  She was on primidone  several years ago and stopped taking it.  She does not recall that she had side effects but it stopped working apparently.  She then tried propranolol but she had side effects including sedation and lethargy from it.  She has been on metoprolol for Gabrielle past several months and tolerates it but still has residual tremors and mild worsening, she does endorse stress within Gabrielle past year particularly with regards to Gabrielle Padilla husband's health.  She has been on generic Lexapro for Gabrielle past several years, currently 10 mg once daily.  I reviewed your office visit note from 09/04/2023, she saw Suzen Rea, NP at Gabrielle time.  She is a non-smoker.  She works part-time for [employer], she works from home.  She lives with Gabrielle Padilla husband.  She drinks caffeine in Gabrielle form of tea, usually 1 cup in Gabrielle morning and sometimes 1 glass of unsweetened tea with lunch.  She does not drink alcohol on a regular basis, she is a non-smoker.  She was recently diagnosed with obstructive sleep apnea and started using a CPAP machine about 2 weeks ago.  She is still adjusting to treatment.  She tries to hydrate well but admits that she may not drink more  than 36 ounces of water per day on average.   She had blood work through your office on 07/31/2023 and I reviewed Gabrielle results in Gabrielle Padilla paper chart.  TSH was unremarkable, CMP benign, vitamin D in Gabrielle low normal range.  Gabrielle Padilla Past Medical History Is Significant For: Past Medical History:  Diagnosis Date   Hyperlipidemia    Hypertension    Mitral valve disorder    Obesity    Vertebral fracture, osteoporotic (HCC)     Gabrielle Padilla Past Surgical History Is Significant For: Past Surgical History:  Procedure Laterality Date   CESAREAN SECTION     x 3   WRIST SURGERY      Gabrielle Padilla Family History Is  Significant For: Family History  Problem Relation Age of Onset   Coronary artery disease Mother    Hypertension Mother    Tremor Mother    Coronary artery disease Father    Parkinson's disease Father    Tremor Sister    Tremor Brother    Coronary artery disease Paternal Grandfather     Gabrielle Padilla Social History Is Significant For: Social History   Socioeconomic History   Marital status: Married    Spouse name: Not on file   Number of children: Not on file   Years of education: Not on file   Highest education level: Not on file  Occupational History   Not on file  Tobacco Use   Smoking status: Never   Smokeless tobacco: Not on file  Vaping Use   Vaping status: Never Used  Substance and Sexual Activity   Alcohol use: No   Drug use: No   Sexual activity: Not on file  Other Topics Concern   Not on file  Social History Narrative   Pt lives with family    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   cetirizine (KLS ALLER-TEC) 10 MG tablet Take 10 mg by mouth daily.   Cholecalciferol (VITAMIN D3) 50 MCG (2000 UT) TABS Take by mouth.   escitalopram (LEXAPRO) 10 MG tablet Take 10 mg by mouth at bedtime.   losartan-hydrochlorothiazide  (HYZAAR) 100-12.5 MG tablet Take 1 tablet by mouth daily.   metoprolol succinate (TOPROL-XL) 25 MG 24 hr tablet Take 25 mg by mouth every morning.   Multiple Vitamins-Minerals (WOMENS 50+ MULTI VITAMIN) TABS Take by mouth.   omeprazole (PRILOSEC) 40 MG capsule Take 40 mg by mouth daily.   primidone  (MYSOLINE ) 50 MG tablet TAKE 1 TABLET (50 MG TOTAL) BY MOUTH AT  BEDTIME. THIS IS Gabrielle FINAL DOSE, FOLLOW INSTRUCTIONS PROVIDED VERBALLY AND IN AFTER VISIT SUMMARY   rosuvastatin (CRESTOR) 20 MG tablet Take 20 mg by mouth daily.   azithromycin  (ZITHROMAX ) 250 MG tablet 2 tabs poqday1, 1 tab poqday 2-5 (Padilla not taking: Reported on 12/15/2023)   benazepril -hydrochlorthiazide (LOTENSIN  HCT) 20-12.5 MG tablet TAKE 1 TABLET DAILY (Padilla not taking: Reported on 12/15/2023)   benzonatate  (TESSALON ) 200 MG capsule Take 1 capsule (200 mg total) by mouth every 8 (eight) hours. (Padilla not taking: Reported on 12/15/2023)   HYDROcodone -homatropine (HYCODAN)  5-1.5 MG/5ML syrup Take 5 mLs by mouth every 8 (eight) hours as needed for cough. (Padilla not taking: Reported on 12/15/2023)   No facility-administered encounter medications on file as of 06/20/2024.  :  Review of Systems:  Out of a complete 14 point review of systems, all are reviewed and negative with Gabrielle exception of these symptoms as listed below:  Review of Systems  Objective:  Neurological Exam  Physical Exam Physical Examination:   Vitals:   06/20/24 1317  BP: 127/64  Pulse: 63    General Examination: Gabrielle Padilla is a very pleasant 68 y.o. female in no acute distress. She appears well-developed and well-nourished and well groomed.   HEENT: Normocephalic, atraumatic, pupils are equal, round and reactive to light, tracking is well-preserved, corrective eyeglasses in place, no nystagmus, mild lazy eye noted.  Face is symmetric otherwise with normal facial animation, speech is clear without dysarthria, hypophonia or significant voice tremor.  She does have a slight intermittent lower jaw and lip tremor.  Neck is supple with full range of passive and active motion. There are no carotid bruits on auscultation. Oropharynx exam reveals: mild mouth dryness, full dentures in place, tongue protrudes centrally and palate elevates symmetrically.    Chest: Clear to auscultation without wheezing, rhonchi or  crackles noted.   Heart: S1+S2+0, regular and normal without murmurs, rubs or gallops noted.    Abdomen: Soft, non-tender and non-distended.   Extremities: There is no obvious swelling in Gabrielle distal lower extremities bilaterally.    Skin: Warm and dry without trophic changes noted.    Musculoskeletal: exam reveals no obvious joint deformities.    Neurologically:  Mental status: Gabrielle Padilla is awake, alert and oriented in all 4 spheres. Gabrielle Padilla immediate and remote memory, attention, language skills and fund of knowledge are appropriate. There is no evidence of aphasia, agnosia, apraxia or anomia. Speech is clear with normal prosody and enunciation. Thought process is linear. Mood is normal and affect is normal.  Cranial nerves II - XII are as described above under HEENT exam.  Motor exam: Normal bulk, strength and tone is noted. There is no resting tremor.  She has a very slight upper extremity postural and action tremor, no intention tremor.     (On 12/15/2023: On Archimedes spiral drawing she has a mild tremor bilaterally.  Handwriting is legible, slightly tremulous, not micrographic.)     Fine motor skills and coordination: Globally mild impairment in fine motor skills, no lateralization.   Cerebellar testing: No dysmetria or intention tremor. There is no truncal or gait ataxia.   Sensory exam: intact to light touch in Gabrielle upper and lower extremities.  Gait, station and balance: She stands easily. No veering to one side is noted. No leaning to one side is noted. Posture is age-appropriate and stance is narrow based. Gait is slightly cautious but otherwise unremarkable.   Preserved arm swing bilaterally.  Assessment and Plan:  In summary, Gabrielle Padilla is a 68 year old female with an underlying medical history of hypertension, hyperlipidemia, mitral valve disorder, anxiety, reflux disease, OSA, on PAP therapy, and obesity, who presents for follow-up consultation of Gabrielle Padilla several year history of  tremors.  History and examination are in keeping with essential tremor.  She has had progression over time.  She is currently on low-dose metoprolol and we started Gabrielle Padilla on low-dose primidone  back in April 2025.  She tolerates Gabrielle medication, tremor is slightly better.  She is enjoying retirement since September of this year.  We talked about Gabrielle importance of fall prevention, monitoring balance issues, staying well-hydrated with water, limiting caffeine, staying consistent with PAP therapy which is managed by another provider.  She is advised regarding typical triggers and alleviating factors for tremors again today.  We mutually agreed to continue with low-dose primidone .  She has Gabrielle metoprolol through another provider.  At this juncture, she is advised to follow-up routinely in our clinic to see one of our nurse practitioners in 1 year.  If she is stable at Gabrielle time and tolerates Gabrielle primidone  at Gabrielle current dose, we can certainly release Gabrielle Padilla back to PCP at that point if she wishes.  I answered all Gabrielle Padilla questions today and she was in agreement with our approach.  I would be cautious with any increase in Gabrielle Padilla primidone  because of Gabrielle Padilla concern about balance issues.  I would also be cautious about increasing Gabrielle Padilla beta-blocker because Gabrielle Padilla heart rate is already in Gabrielle low 60s. I spent 30 minutes in total face-to-face time and in reviewing records during pre-charting, more than 50% of which was spent in counseling and coordination of care, reviewing test results, reviewing medications and treatment regimen and/or in discussing or reviewing Gabrielle diagnosis of ET, Gabrielle prognosis and treatment options. Pertinent laboratory and imaging test results that were available during this visit with Gabrielle Padilla were reviewed by me and considered in my medical decision making (see chart for details).

## 2025-06-20 ENCOUNTER — Ambulatory Visit: Admitting: Adult Health
